# Patient Record
Sex: Male | Born: 1960 | Race: White | Hispanic: No | Marital: Married | Smoking: Never smoker
Health system: Southern US, Community
[De-identification: ages and names within clinical notes are randomized; demographics above are authoritative.]

## PROBLEM LIST (undated history)

## (undated) HISTORY — PX: HERNIA REPAIR: SHX51

---

## 1998-06-02 ENCOUNTER — Ambulatory Visit (HOSPITAL_BASED_OUTPATIENT_CLINIC_OR_DEPARTMENT_OTHER): Admission: RE | Admit: 1998-06-02 | Discharge: 1998-06-02 | Payer: Self-pay | Admitting: *Deleted

## 2000-04-09 ENCOUNTER — Emergency Department (HOSPITAL_COMMUNITY): Admission: EM | Admit: 2000-04-09 | Discharge: 2000-04-09 | Payer: Self-pay | Admitting: Emergency Medicine

## 2000-04-09 ENCOUNTER — Encounter: Payer: Self-pay | Admitting: Emergency Medicine

## 2002-12-03 ENCOUNTER — Emergency Department (HOSPITAL_COMMUNITY): Admission: EM | Admit: 2002-12-03 | Discharge: 2002-12-03 | Payer: Self-pay | Admitting: Emergency Medicine

## 2010-07-17 ENCOUNTER — Emergency Department (HOSPITAL_COMMUNITY)
Admission: EM | Admit: 2010-07-17 | Discharge: 2010-07-17 | Disposition: A | Discharge status: Home / Self Care | Payer: Worker's Compensation | Attending: Emergency Medicine | Admitting: Emergency Medicine

## 2010-07-17 DIAGNOSIS — Z7721 Contact with and (suspected) exposure to potentially hazardous body fluids: Secondary | ICD-10-CM | POA: Insufficient documentation

## 2010-07-17 LAB — HEPATITIS C ANTIBODY (REFLEX): HCV Ab: NEGATIVE

## 2010-07-17 LAB — HEPATITIS B SURFACE ANTIGEN: Hepatitis B Surface Ag: NEGATIVE

## 2010-07-17 LAB — HIV ANTIBODY (ROUTINE TESTING W REFLEX): HIV: NONREACTIVE

## 2011-05-26 ENCOUNTER — Encounter: Payer: Self-pay | Admitting: *Deleted

## 2011-05-26 ENCOUNTER — Emergency Department (HOSPITAL_COMMUNITY)
Admission: EM | Admit: 2011-05-26 | Discharge: 2011-05-26 | Disposition: A | Discharge status: Home / Self Care | Payer: 59 | Attending: Emergency Medicine | Admitting: Emergency Medicine

## 2011-05-26 ENCOUNTER — Emergency Department (HOSPITAL_COMMUNITY): Payer: 59

## 2011-05-26 DIAGNOSIS — N23 Unspecified renal colic: Secondary | ICD-10-CM

## 2011-05-26 DIAGNOSIS — R109 Unspecified abdominal pain: Secondary | ICD-10-CM | POA: Insufficient documentation

## 2011-05-26 DIAGNOSIS — N201 Calculus of ureter: Secondary | ICD-10-CM | POA: Insufficient documentation

## 2011-05-26 DIAGNOSIS — N509 Disorder of male genital organs, unspecified: Secondary | ICD-10-CM | POA: Insufficient documentation

## 2011-05-26 LAB — BASIC METABOLIC PANEL
BUN: 15 mg/dL (ref 6–23)
Creatinine, Ser: 1.02 mg/dL (ref 0.50–1.35)
GFR calc Af Amer: >90 mL/min (ref >90)
GFR calc non Af Amer: 83 mL/min — ABNORMAL LOW (ref >90)
Potassium: 4.1 mEq/L (ref 3.5–5.1)

## 2011-05-26 MED ORDER — SODIUM CHLORIDE 0.9 % IV BOLUS (SEPSIS)
1000.0000 mL | Freq: Once | INTRAVENOUS | Status: AC
  Administered 2011-05-26: 1000 mL via INTRAVENOUS

## 2011-05-26 MED ORDER — OXYCODONE-ACETAMINOPHEN 5-325 MG PO TABS: 1.0000 | ORAL_TABLET | Freq: Four times a day (QID) | ORAL | Status: AC | PRN

## 2011-05-26 MED ORDER — NAPROXEN 500 MG PO TABS: 500.0000 mg | ORAL_TABLET | Freq: Two times a day (BID) | ORAL | Status: AC

## 2011-05-26 MED ORDER — ONDANSETRON HCL 4 MG/2ML IJ SOLN: 4.0000 mg | Freq: Once | INTRAMUSCULAR | Status: DC

## 2011-05-26 MED ORDER — KETOROLAC TROMETHAMINE 30 MG/ML IJ SOLN
30.0000 mg | Freq: Once | INTRAMUSCULAR | Status: AC
  Administered 2011-05-26: 30 mg via INTRAVENOUS
  Filled 2011-05-26: qty 1

## 2011-05-26 MED ORDER — ONDANSETRON HCL 4 MG PO TABS: 4.0000 mg | ORAL_TABLET | Freq: Four times a day (QID) | ORAL | Status: AC

## 2011-05-26 MED ORDER — HYDROMORPHONE HCL PF 1 MG/ML IJ SOLN
1.0000 mg | Freq: Once | INTRAMUSCULAR | Status: AC
  Administered 2011-05-26: 1 mg via INTRAVENOUS
  Filled 2011-05-26: qty 1

## 2011-05-26 MED ORDER — TAMSULOSIN HCL 0.4 MG PO CAPS: 0.4000 mg | ORAL_CAPSULE | Freq: Every day | ORAL | Status: DC

## 2011-05-26 MED ORDER — ONDANSETRON HCL 4 MG/2ML IJ SOLN
INTRAMUSCULAR | Status: AC
  Filled 2011-05-26: qty 2

## 2011-05-26 MED ORDER — ONDANSETRON HCL 4 MG/2ML IJ SOLN
4.0000 mg | Freq: Once | INTRAMUSCULAR | Status: AC
  Administered 2011-05-26: 4 mg via INTRAVENOUS
  Filled 2011-05-26: qty 2

## 2011-05-26 NOTE — ED Provider Notes (Signed)
History     CSN: [id number]  Arrival date & time 05/26/11  4098   First MD Initiated Contact with Patient 05/26/11 0840      Chief Complaint  Patient presents with  . Nephrolithiasis    (Consider location/radiation/quality/duration/timing/severity/associated sxs/prior treatment) HPI Comments: Intermittent left flank pain over the past 2 days however awoke this morning with worsening left flank pain. Has associated nausea and vomiting. Had one episode of nonbloody, nonbilious emesis. He denies dysuria, frequency, urgency, hematuria. No prior history of stones. There is no known injury. He denies loss of bowel or bladder function.  Patient is a 50 y.o. male presenting with flank pain. The history is provided by the patient. No language interpreter was used.  Flank Pain This is a new problem. The current episode started 2 days ago. Episode frequency: intermittently. The problem has been gradually worsening. Pertinent negatives include no chest pain, no abdominal pain, no headaches and no shortness of breath. The symptoms are aggravated by nothing. The symptoms are relieved by nothing. He has tried nothing for the symptoms. The treatment provided no relief.    History reviewed. No pertinent past medical history.  Past Surgical History  Procedure Date  . Hernia repair     No family history on file.  History  Substance Use Topics  . Smoking status: Never Smoker   . Smokeless tobacco: Not on file  . Alcohol Use: Yes     couple times a week      Review of Systems  Constitutional: Negative for fever, activity change, appetite change and fatigue.  HENT: Negative for congestion, sore throat, rhinorrhea, neck pain and neck stiffness.   Respiratory: Negative for cough and shortness of breath.   Cardiovascular: Negative for chest pain and palpitations.  Gastrointestinal: Positive for nausea and vomiting. Negative for abdominal pain, diarrhea and constipation.  Genitourinary:  Positive for flank pain and testicular pain. Negative for dysuria, urgency, frequency and hematuria.  Musculoskeletal: Positive for back pain. Negative for myalgias and arthralgias.  Neurological: Negative for dizziness, weakness, light-headedness, numbness and headaches.  All other systems reviewed and are negative.    Allergies  Review of patient's allergies indicates no known allergies.  Home Medications   Current Outpatient Rx  Name Route Sig Dispense Refill  . RANITIDINE HCL 150 MG PO TABS Oral Take 150 mg by mouth daily.      Marland Kitchen NAPROXEN 500 MG PO TABS Oral Take 1 tablet (500 mg total) by mouth 2 (two) times daily. 30 tablet 0  . ONDANSETRON HCL 4 MG PO TABS Oral Take 1 tablet (4 mg total) by mouth every 6 (six) hours. 12 tablet 0  . OXYCODONE-ACETAMINOPHEN 5-325 MG PO TABS Oral Take 1-2 tablets by mouth every 6 (six) hours as needed for pain. 20 tablet 0  . TAMSULOSIN HCL 0.4 MG PO CAPS Oral Take 1 capsule (0.4 mg total) by mouth daily. 10 capsule 0    BP 132/71  Pulse 69  Temp(Src) 97.1 F (36.2 C) (Oral)  Resp 16  SpO2 100%  Physical Exam  Nursing note and vitals reviewed. Constitutional: He is oriented to person, place, and time. He appears well-developed and well-nourished.       Uncomfortable appearing  HENT:  Head: Normocephalic and atraumatic.  Mouth/Throat: Oropharynx is clear and moist.  Eyes: Conjunctivae and EOM are normal. Pupils are equal, round, and reactive to light.  Neck: Normal range of motion. Neck supple.  Cardiovascular: Normal rate, regular rhythm, normal heart sounds and  intact distal pulses.  Exam reveals no gallop and no friction rub.   No murmur heard. Pulmonary/Chest: Effort normal and breath sounds normal. No respiratory distress.  Abdominal: Soft. Bowel sounds are normal. There is no tenderness. Hernia confirmed negative in the right inguinal area and confirmed negative in the left inguinal area.       No cva tenderness  Genitourinary:  Testes normal and penis normal. Cremasteric reflex is present. Right testis shows no mass, no swelling and no tenderness. Left testis shows no mass, no swelling and no tenderness.  Musculoskeletal: Normal range of motion. He exhibits no tenderness.  Neurological: He is alert and oriented to person, place, and time.  Skin: Skin is warm and dry. No rash noted.    ED Course  Procedures (including critical care time)  Labs Reviewed  BASIC METABOLIC PANEL - Abnormal; Notable for the following:    Glucose, Bld 119 (*)    GFR calc non Af Amer 83 (*)    All other components within normal limits  URINALYSIS, ROUTINE W REFLEX MICROSCOPIC   Ct Abdomen Pelvis Wo Contrast  05/26/2011  *RADIOLOGY REPORT*  Clinical Data: Left flank pain, nausea/vomiting, history of hernia repair, evaluate for kidney stone  CT ABDOMEN AND PELVIS WITHOUT CONTRAST  Technique:  Multidetector CT imaging of the abdomen and pelvis was performed following the standard protocol without intravenous contrast.  Comparison: None.  Findings: Linear scarring versus atelectasis in the right middle lobe and bilateral lower lobes.  Unenhanced liver, spleen, pancreas, and adrenal glands are within normal limits.  Gallbladder is unremarkable.  No intrahepatic or extrahepatic ductal dilatation.  Kidneys are within normal limits.  Mild fullness of the left renal collecting system.  No evidence of bowel obstruction.  Normal appendix.  No evidence of abdominal aortic aneurysm.  No abdominopelvic ascites.  No suspicious abdominopelvic lymphadenopathy.  Prostate is unremarkable.  2 mm calculus in the distal left ureter (series 2/image 82). Bladder is unremarkable.  Tiny fat containing right inguinal hernia.  Mild degenerative changes of the visualized thoracolumbar spine.  IMPRESSION: 2 mm calculus in the distal left ureter with mild fullness of the left renal collecting system.  Original Report Authenticated By: Charline Bills, M.D.     1.  Ureterolithiasis   2. Renal colic on left side       MDM  CT shows a ureteral stone with no evidence of obstruction. Patient received IV fluids, pain medication, antiemetics with improvement of his symptoms. At this time the patient is stable for discharge to home. I encouraged him to strain his urine and followup with urology within one week. He'll call today to make an appointment. He'll be discharged home with Flomax, Percocet, Naprosyn, Zofran. provided clear signs and symptoms for which to return to the emergency department        Dayton Bailiff, MD 05/26/11 1045

## 2011-05-26 NOTE — ED Notes (Signed)
C/o left lower back pain accompanied by nausea started yesterday but has increased today, can void and has had some diarrhea

## 2011-05-26 NOTE — ED Notes (Signed)
Pt believes he may have a kidney stone. Reports L flank pain and nausea beginning this am.

## 2011-05-26 NOTE — ED Notes (Signed)
Patient and wife verbalized understanding of discharge instructions and prescriptions

## 2013-01-08 ENCOUNTER — Emergency Department (HOSPITAL_COMMUNITY): Payer: Worker's Compensation

## 2013-01-08 ENCOUNTER — Encounter (HOSPITAL_COMMUNITY): Payer: Self-pay | Admitting: Radiology

## 2013-01-08 ENCOUNTER — Emergency Department (HOSPITAL_COMMUNITY)
Admission: EM | Admit: 2013-01-08 | Discharge: 2013-01-08 | Disposition: A | Discharge status: Home / Self Care | Payer: Worker's Compensation | Attending: Orthopedic Surgery | Admitting: Orthopedic Surgery

## 2013-01-08 DIAGNOSIS — S52121A Displaced fracture of head of right radius, initial encounter for closed fracture: Secondary | ICD-10-CM

## 2013-01-08 DIAGNOSIS — W108XXA Fall (on) (from) other stairs and steps, initial encounter: Secondary | ICD-10-CM | POA: Insufficient documentation

## 2013-01-08 DIAGNOSIS — S52009A Unspecified fracture of upper end of unspecified ulna, initial encounter for closed fracture: Secondary | ICD-10-CM | POA: Insufficient documentation

## 2013-01-08 DIAGNOSIS — S42401A Unspecified fracture of lower end of right humerus, initial encounter for closed fracture: Secondary | ICD-10-CM

## 2013-01-08 DIAGNOSIS — Y929 Unspecified place or not applicable: Secondary | ICD-10-CM | POA: Insufficient documentation

## 2013-01-08 DIAGNOSIS — S53106A Unspecified dislocation of unspecified ulnohumeral joint, initial encounter: Secondary | ICD-10-CM | POA: Insufficient documentation

## 2013-01-08 DIAGNOSIS — S52041A Displaced fracture of coronoid process of right ulna, initial encounter for closed fracture: Secondary | ICD-10-CM

## 2013-01-08 DIAGNOSIS — Y9389 Activity, other specified: Secondary | ICD-10-CM | POA: Insufficient documentation

## 2013-01-08 LAB — BASIC METABOLIC PANEL
Chloride: 105 mEq/L (ref 96–112)
GFR calc Af Amer: >90 mL/min (ref >90)
Potassium: 4.2 mEq/L (ref 3.5–5.1)

## 2013-01-08 LAB — CBC WITH DIFFERENTIAL/PLATELET
Basophils Absolute: 0 10*3/uL (ref 0.0–0.1)
Basophils Relative: 0 % (ref 0–1)
MCHC: 35.6 g/dL (ref 30.0–36.0)
Neutro Abs: 6.5 10*3/uL (ref 1.7–7.7)
Neutrophils Relative %: 81 % — ABNORMAL HIGH (ref 43–77)
RDW: 13 % (ref 11.5–15.5)

## 2013-01-08 MED ORDER — OXYCODONE-ACETAMINOPHEN 5-325 MG PO TABS
2.0000 | ORAL_TABLET | Freq: Once | ORAL | Status: AC
  Administered 2013-01-08: 2 via ORAL
  Filled 2013-01-08: qty 2

## 2013-01-08 MED ORDER — NALOXONE HCL 0.4 MG/ML IJ SOLN
INTRAMUSCULAR | Status: AC
  Filled 2013-01-08: qty 1

## 2013-01-08 MED ORDER — NAPROXEN 250 MG PO TABS
500.0000 mg | ORAL_TABLET | Freq: Once | ORAL | Status: AC
  Administered 2013-01-08: 500 mg via ORAL
  Filled 2013-01-08: qty 2

## 2013-01-08 MED ORDER — NAPROXEN 500 MG PO TABS: 500.0000 mg | ORAL_TABLET | Freq: Two times a day (BID) | ORAL | Status: AC

## 2013-01-08 MED ORDER — ONDANSETRON HCL 4 MG/2ML IJ SOLN
INTRAMUSCULAR | Status: AC
  Administered 2013-01-08: 4 mg
  Filled 2013-01-08: qty 2

## 2013-01-08 MED ORDER — OXYCODONE-ACETAMINOPHEN 5-325 MG PO TABS: 1.0000 | ORAL_TABLET | ORAL | Status: DC | PRN

## 2013-01-08 MED ORDER — NAPROXEN 500 MG PO TABS: 500.0000 mg | ORAL_TABLET | Freq: Two times a day (BID) | ORAL | Status: DC

## 2013-01-08 MED ORDER — FENTANYL CITRATE 0.05 MG/ML IJ SOLN
200.0000 ug | Freq: Once | INTRAMUSCULAR | Status: AC
  Administered 2013-01-08: 200 ug via INTRAVENOUS
  Filled 2013-01-08: qty 4

## 2013-01-08 MED ORDER — ETOMIDATE 2 MG/ML IV SOLN
10.0000 mg | Freq: Once | INTRAVENOUS | Status: AC
  Administered 2013-01-08: 10 mg via INTRAVENOUS
  Filled 2013-01-08: qty 10

## 2013-01-08 MED ORDER — ONDANSETRON HCL 4 MG/2ML IJ SOLN
4.0000 mg | Freq: Once | INTRAMUSCULAR | Status: AC
  Administered 2013-01-08: 4 mg via INTRAVENOUS
  Filled 2013-01-08: qty 2

## 2013-01-08 MED ORDER — FENTANYL CITRATE 0.05 MG/ML IJ SOLN
100.0000 ug | INTRAMUSCULAR | Status: DC | PRN
  Administered 2013-01-08: 50 ug via INTRAVENOUS
  Filled 2013-01-08: qty 4

## 2013-01-08 MED ORDER — OXYCODONE-ACETAMINOPHEN 5-325 MG PO TABS: 1.0000 | ORAL_TABLET | ORAL | Status: AC | PRN

## 2013-01-08 NOTE — ED Notes (Signed)
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  He states that he slipped and did not pass out.  The fall resulted in R elbow pain.  He reports that after the fall he walked over to the truck and attempted to sit on the bumper when he reports to having passed out.  Denies neck or back pain.  + PMS in R hand distal to the injury.  Pt received 200 mcg of Fentanyl IV.

## 2013-01-08 NOTE — ED Provider Notes (Signed)
CSN: [id number]     Arrival date & time 01/08/13  4098 History     First MD Initiated Contact with Patient 01/08/13 0848     Chief Complaint  Patient presents with  . Elbow Pain   (Consider location/radiation/quality/duration/timing/severity/associated sxs/prior Treatment) HPI Comments: Patient is a 52 year old male with no significant past medical history who presents with right elbow pain that started after a mechanical fall that occurred prior to arrival. 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 He landed on his right elbow and reports sudden onset of throbbing, severe pain without radiation. He denies head trauma or LOC. Patient denies any other injury. Movement and palpation of the affected area makes the pain worse. Nothing makes the pain better. Patient received fentanyl for pain per EMS en route.    No past medical history on file. Past Surgical History  Procedure Laterality Date  . Hernia repair     No family history on file. History  Substance Use Topics  . Smoking status: Never Smoker   . Smokeless tobacco: Not on file  . Alcohol Use: Yes     Comment: couple times a week    Review of Systems  Musculoskeletal: Positive for arthralgias.  All other systems reviewed and are negative.    Allergies  Review of patient's allergies indicates no known allergies.  Home Medications   Current Outpatient Rx  Name  Route  Sig  Dispense  Refill  . ranitidine (ZANTAC) 150 MG tablet   Oral   Take 150 mg by mouth daily as needed for heartburn.           BP 107/72  Pulse 71  Resp 18  SpO2 94% Physical Exam  Nursing note and vitals reviewed. Constitutional: He is oriented to person, place, and time. He appears well-developed and well-nourished. No distress.  HENT:  Head: Normocephalic and atraumatic.  Eyes: Conjunctivae are normal.  Neck: Normal range of motion.  Cardiovascular: Normal rate, regular rhythm and  intact distal pulses.  Exam reveals no gallop and no friction rub.   No murmur heard. Sufficient distal capillary refill of upper extremities.   Pulmonary/Chest: Effort normal and breath sounds normal. He has no wheezes. He has no rales. He exhibits no tenderness.  Abdominal: Soft. There is no tenderness.  Musculoskeletal:  Right elbow AC area tender to palpation. No obvious deformity. ROM limited due to pain. No right wrist or right elbow tenderness to palpation, deformity or tenderness to palpate.    Neurological: He is alert and oriented to person, place, and time.  Upper extremity sensation intact and equal bilaterally. Speech is goal-oriented. Moves limbs without ataxia.   Skin: Skin is warm and dry.  Psychiatric: He has a normal mood and affect. His behavior is normal.    ED Course   Procedures (including critical care time)  Labs Reviewed - No data to display Dg Elbow Complete Right  01/08/2013   *RADIOLOGY REPORT*  Clinical Data: Pain post trauma  RIGHT ELBOW - COMPLETE 3+ VIEW  Comparison: None.  Findings: Frontal, oblique, and lateral images were obtained. There is a comminuted fracture of the proximal radius with multiple fracture fragments arising from the dorsal head of the radius displaced distally.  There is dislocation at the elbow joint with the ulna displaced posterior to the humerus.  There is a tiny fracture fragment within the elbow joint.  IMPRESSION: Comminuted fracture of the proximal  radius along the dorsal aspect with dislocation of the elbow joint.  A tiny fracture fragment is located within the elbow joint region.   Original Report Authenticated By: Bretta Bang, M.D.   1. Radial head fracture, closed, right, initial encounter   2. Fracture of coronoid process of right ulna   3. Fracture dislocation of elbow joint, right, closed, initial encounter     MDM  8:55 AM Xray of right elbow pending. No signs of neurovascular compromise. Patient will have fentanyl  for pain.    9:37 AM Patient's xray shows comminuted fracture of proximal radius and dislocation of elbow joint. I will consult Orthopedics.   Joint reduced and splinted. CT of elbow done. Dr. Janee Morn will see the patient in the ED.   Emilia Beck, [place] 01/10/13 [phone]

## 2013-01-08 NOTE — Consult Note (Signed)
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 In the emergency department he is recognized a comminuted radial head fracture and a incomplete dislocation/subluxation of the ulna humeral joint. Closed reduction was successfully performed by the emergency department providers and he is in a sling. His pain is reasonably well-controlled and he has remained n.p.o.   History reviewed. No pertinent past medical history. Past Surgical History  Procedure Laterality Date  . Hernia repair     History   Social History  . Marital Status: Single    Spouse Name: N/A    Number of Children: N/A  . Years of Education: N/A   Social History Main Topics  . Smoking status: Never Smoker   . Smokeless tobacco: None  . Alcohol Use: Yes     Comment: couple times a week  . Drug Use: No  . Sexually Active:    Other Topics Concern  . None   Social History Narrative  . None   No family history on file. No Known Allergies Prior to Admission medications   Medication Sig Start Date End Date Taking? Authorizing Provider  ranitidine (ZANTAC) 150 MG tablet Take 150 mg by mouth daily as needed for heartburn.    Yes Historical Provider, MD  naproxen (NAPROSYN) 500 MG tablet Take 1 tablet (500 mg total) by mouth 2 (two) times daily with a meal. 01/08/13   Jodi Marble, MD  oxyCODONE-acetaminophen (PERCOCET/ROXICET) 5-325 MG per tablet Take 1-2 tablets by mouth every 4 (four) hours as needed for pain. 01/08/13   Jodi Marble, MD   Dg Elbow Complete Right  01/08/2013   *RADIOLOGY REPORT*  Clinical Data: Post reduction for fracture/dislocation  RIGHT ELBOW -  2  VIEW  Comparison: Study obtained earlier in the day  Findings:  Frontal and lateral views were obtained.  The elbow dislocation is been  successfully reduced.  There is a fracture of the dorsal aspect of the radial head in near anatomic alignment. No other fractures.  Joint spaces appear intact.  IMPRESSION: Previous dislocation has been reduced.  Fracture dorsal portion of radial head present.   Original Report Authenticated By: Bretta Bang, M.D.   Dg Elbow Complete Right  01/08/2013   *RADIOLOGY REPORT*  Clinical Data: Pain post trauma  RIGHT ELBOW - COMPLETE 3+ VIEW  Comparison: None.  Findings: Frontal, oblique, and lateral images were obtained. There is a comminuted fracture of the proximal radius with multiple fracture fragments arising from the dorsal head of the radius displaced distally.  There is dislocation at the elbow joint with the ulna displaced posterior to the humerus.  There is a tiny fracture fragment within the elbow joint.  IMPRESSION: Comminuted fracture of the proximal radius along the dorsal aspect with dislocation of the elbow joint.  A tiny fracture fragment is located within the elbow joint region.   Original Report Authenticated By: Bretta Bang, M.D.   Ct Elbow Right W/o Cm  01/08/2013   *RADIOLOGY REPORT*  Clinical Data: Status post elbow dislocation.  CT OF THE RIGHT ELBOW WITHOUT CONTRAST  Technique:  Multidetector CT imaging was performed according to the standard protocol. Multiplanar CT image reconstructions were also generated.  Comparison: Radiographs 01/08/2013.  Findings: The elbow dislocation has been reduced.  There is a comminuted intra-articular fracture of the radial head with slight  volar displacement.  The fracture extends transversely across the midportion of the radial head and the anterior/volar component is split vertically.  Displaced fracture of the coronoid process of the ulna is also demonstrated there is approximately 8 mm of volar displacement. The humerus is intact.  The olecranon is intact.  IMPRESSION:  1.  Comminuted and mildly displaced fractures of the radial head as  described above. 2.  Displaced coronoid process fracture of the ulna. 3.  The humerus is intact.   Original Report Authenticated By: Rudie Meyer, M.D.    Positive ROS: All other systems have been reviewed and were otherwise negative with the exception of those mentioned in the HPI and as above.  Physical Exam: Vitals: Refer to EMR. Constitutional:  WD, WN, NAD HEENT:  NCAT, EOMI Neuro/Psych:  Alert & oriented to person, place, and time; appropriate mood & affect Lymphatic: No generalized UE edema or lymphadenopathy Extremities / MSK:  The extremities are normal with respect to appearance, ranges of motion, joint stability, muscle strength/tone, sensation, & perfusion except as otherwise noted:   Right upper extremity is in a long-arm splint. Fingers are free and not very swollen or puffy. He has intact light touch sensation the radial, median, and ulnar nerve distributions. Motor appears intact, to include AIN and PIN. The fingers are warm with brisk capillary refill  Assessment: Comminuted right radial head fracture in setting of coronoid fracture and elbow instability  Plan: I discussed with the patient and his wife today that he will require skeletal reconstruction. I explained all the reasons why I prefer to perform such at 5-10 days post injury. We discussed a surgical plan includes a lateral approach, reconstruction of the radial head versus prosthetic replacement, and evaluation of the elbow stability, with further repair of structures as indicated. The goal of surgery is to restore a stable functional elbow while minimizing the risk for stiffness. Risks include but are not limited to general anesthetic risks, bleeding, infection, damage to nerves or blood vessels that could result in permanent numbness, weakness, and/or pain. Stiffness, persistent instability, and chronic pain. We will likely proceed surgically next Tuesday. We also discussed the signs and symptoms of neurovascular  compromise and compartment syndrome and he was instructed to seek immediate attention should these develop. He will be discharged with a plan for Percocet for analgesia, naproxen  to reduce inflammation and reduce the risk for heterotopic ossification, and a sling. He was given instructions for elevation and digital range of motion.  Cliffton Asters Janee Morn, MD     Mobile [phone] Orthopaedic & Hand Surgery [facility]opaedic [facility] Sports [facility] [address] [place], [place]  [address] [phone]

## 2013-01-08 NOTE — ED Notes (Signed)
Orthopedic surgery at bedside

## 2013-01-08 NOTE — Progress Notes (Signed)
Orthopedic Tech Progress Note Patient Details:  Anthony Mason [birth year] [id number] Elbow reduction. Posterior long arm splint with sugartong applied. Extra padding added to protect elbow. Application tolerated well.  Ortho Devices Type of Ortho Device: Ace wrap;Post (long arm) splint;Sugartong splint Ortho Device/Splint Location: Right UE Ortho Device/Splint Interventions: Application   Asia R Thompson 01/08/2013, 11:23 AM

## 2013-01-08 NOTE — ED Notes (Signed)
Patient transported to CT 

## 2013-01-08 NOTE — ED Notes (Signed)
Patient transported to X-ray 

## 2013-01-08 NOTE — Discharge Instructions (Addendum)
Discharge Instructions--elbow dislocation with fracture   No work for next 2 weeks.  You will have a dressing with a plaster splint incorporated in it. Move your fingers as much as possible, making a full fist and fully opening the fist. Elevate your hand to reduce pain & swelling of the digits.  Ice over the operative site may be helpful to reduce pain & swelling.  DO NOT USE HEAT. Pain medicine has been prescribed for you.  Use your medicine as needed over the first 48 hours, and then you can begin to taper your use.  You may use Tylenol in place of your prescribed pain medication, but not IN ADDITION to it. Leave the dressing in place until you return to our office.  You may shower, but keep the bandage clean & dry.  My office will contact you this week regarding surgical planning for next week.  Please call [phone] during normal business hours or [phone] after hours for any problems.   *Please note that pain medications will not be refilled after hours or on weekends.  Splint Care Your doctor has applied a splint to rest and protect your injury. Splints can be made of plaster, fiberglass, or metal; they are used to treat fractures, sprains, tendonitis, and other injuries. Please keep your injury elevated to reduce swelling and pressure under your splint. If an elastic bandage has been used to hold the splint, it can be loosened for increased swelling or pain. Try to keep your splint clean and dry. They can be used for weeks if needed to treat serious sprains, or minor fractures. Do not put objects under your splint to scratch yourself. Call your doctor right away if you have:  Increased pain or pressure around the injury.  Numbness, tingling, or painful, cool toes or fingers. Call your doctor for follow up care as recommended, especially if your splint becomes too soft or broken before you are healed. Document Released: 06/23/2004 Document Revised: 08/08/2011 Document  Reviewed: 05/03/2007 [facility] Patient Information 2014 [place], [place].

## 2013-01-09 NOTE — ED Provider Notes (Signed)
Medical screening examination/treatment/procedure(s) were conducted as a shared visit with non-physician practitioner(s) and myself.  I personally evaluated the patient during the encounter.  52yo M, c/o right elbow pain after slip and fall down 3 steps PTA. States he landed on his right elbow. Denies hitting head, no LOC. VSS, A&O, CTA, RRR, abd soft/NT, neuro non-focal. +right elbow TTP with obvious deformity. NMS intact right hand with strong radial pulse, skin w/d/good color, and brisk cap refill in fingers. No open wounds. NT right shoulder/wrist/hand/fingers. Initial XR with comminuted fx proximal radius with posterior dislocation of elbow joint. T/C to Hand Ortho MD Janee Morn, case discussed, including:  HPI, pertinent PM/SHx, VS/PE, dx testing, ED course and treatment: requests EDP to reduce elbow, obtain CT elbow then call back and he can come to ED for eval.  Procedural sedation and dislocation reduction procedure were fully reviewed with the pt and his family, including explanation of risks/benefits. All questions answered. Pt and family verb understanding. Written and verbal informed consent for these procedures was obtained.  Procedures as below. Post XR and CT scan obtained. Hand Ortho Dr. Janee Morn, came to the ED for pt eval post-reduction and CT scan. He evaluated the pt and spoke with pt and his family (see his consult note). States he will d/c pt from the ED today to f/u for surgical repair next Tuesday. Pt and family are agreeable with this plan.   Procedural sedation:  Presedation checklist: Informed consent, including risks and benefits, obtained. Pre-procedural timeout performed with Pre-sedation confirmation of laterality/correct procedure site.  Provider confirms review of the nurses' note, allergies, medications, pertinent labs, PMH, pre-induction vital signs, pulse oximetry, pain level, and patient condition satisfactory for commencing with order for sedation/procedure.  Awake and  alert, Patient on monitor, Continuous pulse oximetry, Supplemental oxygen provided, NPO status verified, Suction available, Sedation reversal agent(s) at bedside as applicable; Resuscitative equipment readily available. Last meal: 6+ hours ago;  Physician time: 15 minutes; ASA Classification: 1. Normal healthy patient.;  Agent: Etomidate, fentanyl; Level: Moderate;  Complications: None; Treatment: None;  Reassessment: Awake, Alert, Oriented, Vital signs normal, Returned to presedation baseline, Procedure completed successfully, No complaints.  Orthopedic Procedure:  Timeout: Pre-procedural timeout; Indication: Posterior ulnar dislocation; Joint: right elbow; Description: Closed; Procedure: Informed consent obtained, Procedural sedation (see note above),  Closed reduction of dislocation, By traction;  Immobilization: By myself, Assisted by orthopedic technician, Long arm posterior splint with forearm sugartong; Post-reduction xray obtained, Reassessment: Pain improved, Neurovascularly intact, strong radial pulse, Anatomic alignment restored.  SPLINT APPLICATION Date/Time: 01/08/2013 11:15 AM Authorized by: Laray Anger Consent: Verbal consent obtained. Risks and benefits: risks, benefits and alternatives were discussed Consent given by: patient Splint applied by: myself and orthopedic technician Location details: right UE Splint type: right long arm posterior splint with forearm sugartong Right arm sling Post-procedure: The splinted body part was neurovascularly unchanged following the procedure. Patient tolerance: Patient tolerated the procedure well with no immediate complications.   Patient tolerated procedures and procedural sedation component as expected without apparent immediate complications.  Physician confirms procedural medication orders as administered, patient was assessed by physician post-procedure, and confirms post-sedation plan of care and disposition.    Dg  Elbow Complete Right 01/08/2013   *RADIOLOGY REPORT*  Clinical Data: Pain post trauma  RIGHT ELBOW - COMPLETE 3+ VIEW  Comparison: None.  Findings: Frontal, oblique, and lateral images were obtained. There is a comminuted fracture of the proximal radius with multiple fracture fragments arising from the dorsal head of the radius  displaced distally.  There is dislocation at the elbow joint with the ulna displaced posterior to the humerus.  There is a tiny fracture fragment within the elbow joint.  IMPRESSION: Comminuted fracture of the proximal radius along the dorsal aspect with dislocation of the elbow joint.  A tiny fracture fragment is located within the elbow joint region.   Original Report Authenticated By: Bretta Bang, M.D.   Dg Elbow Complete Right 01/08/2013   *RADIOLOGY REPORT*  Clinical Data: Post reduction for fracture/dislocation  RIGHT ELBOW -  2  VIEW  Comparison: Study obtained earlier in the day  Findings:  Frontal and lateral views were obtained.  The elbow dislocation is been successfully reduced.  There is a fracture of the dorsal aspect of the radial head in near anatomic alignment. No other fractures.  Joint spaces appear intact.  IMPRESSION: Previous dislocation has been reduced.  Fracture dorsal portion of radial head present.   Original Report Authenticated By: Bretta Bang, M.D.   Ct Elbow Right W/o Cm 01/08/2013   *RADIOLOGY REPORT*  Clinical Data: Status post elbow dislocation.  CT OF THE RIGHT ELBOW WITHOUT CONTRAST  Technique:  Multidetector CT imaging was performed according to the standard protocol. Multiplanar CT image reconstructions were also generated.  Comparison: Radiographs 01/08/2013.  Findings: The elbow dislocation has been reduced.  There is a comminuted intra-articular fracture of the radial head with slight volar displacement.  The fracture extends transversely across the midportion of the radial head and the anterior/volar component is split vertically.   Displaced fracture of the coronoid process of the ulna is also demonstrated there is approximately 8 mm of volar displacement. The humerus is intact.  The olecranon is intact.  IMPRESSION:  1.  Comminuted and mildly displaced fractures of the radial head as described above. 2.  Displaced coronoid process fracture of the ulna. 3.  The humerus is intact.   Original Report Authenticated By: Rudie Meyer, M.D.     Laray Anger, DO 01/09/13 2200

## 2013-01-11 NOTE — ED Provider Notes (Signed)
Medical screening examination/treatment/procedure(s) were conducted as a shared visit with non-physician practitioner(s) and myself.  I personally evaluated the patient during the encounter Please see my previous note  Laray Anger, DO 01/11/13 1456

## 2014-05-14 ENCOUNTER — Emergency Department (INDEPENDENT_AMBULATORY_CARE_PROVIDER_SITE_OTHER): Payer: Worker's Compensation

## 2014-05-14 ENCOUNTER — Encounter (HOSPITAL_COMMUNITY): Payer: Self-pay | Admitting: *Deleted

## 2014-05-14 ENCOUNTER — Emergency Department (INDEPENDENT_AMBULATORY_CARE_PROVIDER_SITE_OTHER)
Admission: EM | Admit: 2014-05-14 | Discharge: 2014-05-14 | Disposition: A | Discharge status: Home / Self Care | Payer: Worker's Compensation | Source: Home / Self Care | Attending: Family Medicine | Admitting: Family Medicine

## 2014-05-14 DIAGNOSIS — T1490XA Injury, unspecified, initial encounter: Secondary | ICD-10-CM

## 2014-05-14 DIAGNOSIS — S51011A Laceration without foreign body of right elbow, initial encounter: Secondary | ICD-10-CM | POA: Diagnosis not present

## 2014-05-14 DIAGNOSIS — T1490 Injury, unspecified: Secondary | ICD-10-CM | POA: Diagnosis not present

## 2014-05-14 DIAGNOSIS — S5001XA Contusion of right elbow, initial encounter: Secondary | ICD-10-CM

## 2014-05-14 DIAGNOSIS — S93402A Sprain of unspecified ligament of left ankle, initial encounter: Secondary | ICD-10-CM

## 2014-05-14 NOTE — ED Notes (Signed)
Pt  Reports   She  Injured   His  r  Elbow  Today        At  Work         [employer]Fell  Felled    And  Sustained  A  Laceration       -  He  Has       Had  Surgery  On that  Elbow     In  Past          He  Also  Twisted  His     l  Ankle       And  Has  Pain  And  Swelling  Present

## 2014-05-14 NOTE — ED Provider Notes (Signed)
CSN: [id number]     Arrival date & time 05/14/14  1655 History   First MD Initiated Contact with Patient 05/14/14 1712     Chief Complaint  Patient presents with  . 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 Left ankle is swollen and but uncomfortable with weight bearing and he has two abrasions to right elbow and joint is mildly tender with ROM. Has surgery to right elbow about 18 mos ago with Dr. Janee Mornhompson. Reported tetanus booster to be UTD as of Sept. 2015.    The history is provided by the patient.    History reviewed. No pertinent past medical history. Past Surgical History  Procedure Laterality Date  . Hernia repair     History reviewed. No pertinent family history. History  Substance Use Topics  . Smoking status: Never Smoker   . Smokeless tobacco: Not on file  . Alcohol Use: Yes     Comment: couple times a week    Review of Systems  All other systems reviewed and are negative.   Allergies  Review of patient's allergies indicates no known allergies.  Home Medications   Prior to Admission medications   Medication Sig Start Date End Date Taking? Authorizing Provider  naproxen (NAPROSYN) 500 MG tablet Take 1 tablet (500 mg total) by mouth 2 (two) times daily with a meal. 01/08/13   Jodi Marbleavid A Thompson, MD  oxyCODONE-acetaminophen (PERCOCET/ROXICET) 5-325 MG per tablet Take 1-2 tablets by mouth every 4 (four) hours as needed for pain. 01/08/13   Jodi Marbleavid A Thompson, MD  ranitidine (ZANTAC) 150 MG tablet Take 150 mg by mouth daily as needed for heartburn.     Historical Provider, MD   BP 130/82 mmHg  Pulse 76  Temp(Src) 97.9 F (36.6 C) (Oral)  Resp 16  SpO2 99% Physical Exam  Constitutional: He is oriented to person,  place, and time. He appears well-developed and well-nourished. No distress.  HENT:  Head: Normocephalic and atraumatic.  Eyes: Conjunctivae are normal.  Cardiovascular: Normal rate.   Pulmonary/Chest: Effort normal.  Musculoskeletal: Normal range of motion.       Right elbow: He exhibits swelling and laceration. He exhibits normal range of motion, no effusion and no deformity. Tenderness found. Olecranon process tenderness noted.       Left ankle: He exhibits swelling. He exhibits normal range of motion, no ecchymosis, no deformity, no laceration and normal pulse. Tenderness. Lateral malleolus tenderness found. No medial malleolus, no head of 5th metatarsal and no proximal fibula tenderness found. Achilles tendon normal.       Arms: Moderate swelling at region of lateral malleolus  Neurological: He is alert and oriented to person, place, and time.  Skin: Skin is warm and dry. No rash noted. No erythema.  See exam of elbow  Psychiatric: He has a normal mood and affect. His behavior is normal.  Nursing note and vitals reviewed.   ED Course  LACERATION REPAIR Date/Time: 05/14/2014 6:41 PM Performed by: Lemmie EvensPRESSON, Nery Kalisz LEE H Authorized by: Bradd CanaryKINDL, JAMES D Consent: Verbal consent obtained. Risks and benefits: risks, benefits and alternatives were discussed Consent given by: patient Patient identity confirmed: verbally with patient Time out: Immediately prior to procedure a "time out" was called to verify the correct patient, procedure, equipment, support staff and site/side marked as required. Body  area: upper extremity Laceration length: 0.5 cm Foreign bodies: no foreign bodies Tendon involvement: none Nerve involvement: none Vascular damage: no Patient sedated: no Preparation: Patient was prepped and draped in the usual sterile fashion. Irrigation solution: saline Irrigation method: syringe Amount of cleaning: standard Debridement: none Skin closure: staples Number of sutures:  1 Approximation: close Approximation difficulty: simple Dressing: gauze roll Patient tolerance: Patient tolerated the procedure well with no immediate complications   (including critical care time) Labs Review Labs Reviewed - No data to display  Imaging Review Dg Elbow Complete Right  05/14/2014   CLINICAL DATA:  Fall today previous fracture 16 months ago, elbow bruising  EXAM: RIGHT ELBOW - COMPLETE 3+ VIEW  COMPARISON:  01/08/2013  FINDINGS: Four views of the right elbow submitted. No acute fracture or subluxation. Metallic fixation screws are noted radial head. Mild degenerative changes elbow joint. No posterior fat pad sign.  IMPRESSION: No acute fracture or subluxation. Postsurgical changes radial head. Mild degenerative changes elbow joint.   Electronically Signed   By: Natasha MeadLiviu  Pop M.D.   On: 05/14/2014 17:56   Dg Ankle Complete Left  05/14/2014   CLINICAL DATA:  Larey SeatFell off curb, twisted left ankle  EXAM: LEFT ANKLE COMPLETE - 3+ VIEW  COMPARISON:  None.  FINDINGS: Three views of left ankle submitted. No acute fracture or subluxation. Ankle mortise is preserved. Soft tissue swelling adjacent to lateral malleolus.  IMPRESSION: No acute fracture or subluxation.  Lateral soft tissue swelling.   Electronically Signed   By: Natasha MeadLiviu  Pop M.D.   On: 05/14/2014 17:57     MDM   1. Elbow contusion, right, initial encounter   2. Injury   3. Elbow laceration, right, initial encounter   4. Left ankle sprain, initial encounter   Films negative for acute fx/sublux. ASO and RICE therapy for left ankle. Weight bearing as tolerated.  RICE, wound care and NSAIDs for right elbow. Ortho follow up (Dr. Janee Mornhompson) if no improvement over the next 1-2 weeks. Staple removal in 10 days      Ria ClockJennifer Lee H Twylla Arceneaux, [place] 05/14/14 [phone]

## 2014-05-14 NOTE — Discharge Instructions (Signed)
Staple removal in 10 days.   Ankle Sprain An ankle sprain is an injury to the strong, fibrous tissues (ligaments) that hold the bones of your ankle joint together.  CAUSES An ankle sprain is usually caused by a fall or by twisting your ankle. Ankle sprains most commonly occur when you step on the outer edge of your foot, and your ankle turns inward. People who participate in sports are more prone to these types of injuries.  SYMPTOMS   Pain in your ankle. The pain may be present at rest or only when you are trying to stand or walk.  Swelling.  Bruising. Bruising may develop immediately or within 1 to 2 days after your injury.  Difficulty standing or walking, particularly when turning corners or changing directions. DIAGNOSIS  Your caregiver will ask you details about your injury and perform a physical exam of your ankle to determine if you have an ankle sprain. During the physical exam, your caregiver will press on and apply pressure to specific areas of your foot and ankle. Your caregiver will try to move your ankle in certain ways. An X-ray exam may be done to be sure a bone was not broken or a ligament did not separate from one of the bones in your ankle (avulsion fracture).  TREATMENT  Certain types of braces can help stabilize your ankle. Your caregiver can make a recommendation for this. Your caregiver may recommend the use of medicine for pain. If your sprain is severe, your caregiver may refer you to a surgeon who helps to restore function to parts of your skeletal system (orthopedist) or a physical therapist. HOME CARE INSTRUCTIONS   Apply ice to your injury for 1-2 days or as directed by your caregiver. Applying ice helps to reduce inflammation and pain.  Put ice in a plastic bag.  Place a towel between your skin and the bag.  Leave the ice on for 15-20 minutes at a time, every 2 hours while you are awake.  Only take over-the-counter or prescription medicines for pain,  discomfort, or fever as directed by your caregiver.  Elevate your injured ankle above the level of your heart as much as possible for 2-3 days.  If your caregiver recommends crutches, use them as instructed. Gradually put weight on the affected ankle. Continue to use crutches or a cane until you can walk without feeling pain in your ankle.  If you have a plaster splint, wear the splint as directed by your caregiver. Do not rest it on anything harder than a pillow for the first 24 hours. Do not put weight on it. Do not get it wet. You may take it off to take a shower or bath.  You may have been given an elastic bandage to wear around your ankle to provide support. If the elastic bandage is too tight (you have numbness or tingling in your foot or your foot becomes cold and blue), adjust the bandage to make it comfortable.  If you have an air splint, you may blow more air into it or let air out to make it more comfortable. You may take your splint off at night and before taking a shower or bath. Wiggle your toes in the splint several times per day to decrease swelling. SEEK MEDICAL CARE IF:   You have rapidly increasing bruising or swelling.  Your toes feel extremely cold or you lose feeling in your foot.  Your pain is not relieved with medicine. SEEK IMMEDIATE MEDICAL CARE IF:  Your toes are numb or blue.  You have severe pain that is increasing. MAKE SURE YOU:   Understand these instructions.  Will watch your condition.  Will get help right away if you are not doing well or get worse. Document Released: 05/16/2005 Document Revised: 02/08/2012 Document Reviewed: 05/28/2011 [facility] Patient Information 2015 [place], [place]. This information is not intended to replace advice given to you by your health care provider. Make sure you discuss any questions you have with your health care provider.  Contusion A contusion is a deep bruise. Contusions are the result of an injury that caused  bleeding under the skin. The contusion may turn blue, purple, or yellow. Minor injuries will give you a painless contusion, but more severe contusions may stay painful and swollen for a few weeks.  CAUSES  A contusion is usually caused by a blow, trauma, or direct force to an area of the body. SYMPTOMS   Swelling and redness of the injured area.  Bruising of the injured area.  Tenderness and soreness of the injured area.  Pain. DIAGNOSIS  The diagnosis can be made by taking a history and physical exam. An X-ray, CT scan, or MRI may be needed to determine if there were any associated injuries, such as fractures. TREATMENT  Specific treatment will depend on what area of the body was injured. In general, the best treatment for a contusion is resting, icing, elevating, and applying cold compresses to the injured area. Over-the-counter medicines may also be recommended for pain control. Ask your caregiver what the best treatment is for your contusion. HOME CARE INSTRUCTIONS   Put ice on the injured area.  Put ice in a plastic bag.  Place a towel between your skin and the bag.  Leave the ice on for 15-20 minutes, 3-4 times a day, or as directed by your health care provider.  Only take over-the-counter or prescription medicines for pain, discomfort, or fever as directed by your caregiver. Your caregiver may recommend avoiding anti-inflammatory medicines (aspirin, ibuprofen, and naproxen) for 48 hours because these medicines may increase bruising.  Rest the injured area.  If possible, elevate the injured area to reduce swelling. SEEK IMMEDIATE MEDICAL CARE IF:   You have increased bruising or swelling.  You have pain that is getting worse.  Your swelling or pain is not relieved with medicines. MAKE SURE YOU:   Understand these instructions.  Will watch your condition.  Will get help right away if you are not doing well or get worse. Document Released: 02/23/2005 Document Revised:  05/21/2013 Document Reviewed: 03/21/2011 [facility] Patient Information 2015 [place], [place]. This information is not intended to replace advice given to you by your health care provider. Make sure you discuss any questions you have with your health care provider.  Laceration Care, Adult A laceration is a cut or lesion that goes through all layers of the skin and into the tissue just beneath the skin. TREATMENT  Some lacerations may not require closure. Some lacerations may not be able to be closed due to an increased risk of infection. It is important to see your caregiver as soon as possible after an injury to minimize the risk of infection and maximize the opportunity for successful closure. If closure is appropriate, pain medicines may be given, if needed. The wound will be cleaned to help prevent infection. Your caregiver will use stitches (sutures), staples, wound glue (adhesive), or skin adhesive strips to repair the laceration. These tools bring the skin edges together to  allow for faster healing and a better cosmetic outcome. However, all wounds will heal with a scar. Once the wound has healed, scarring can be minimized by covering the wound with sunscreen during the day for 1 full year. HOME CARE INSTRUCTIONS  For sutures or staples:  Keep the wound clean and dry.  If you were given a bandage (dressing), you should change it at least once a day. Also, change the dressing if it becomes wet or dirty, or as directed by your caregiver.  Wash the wound with soap and water 2 times a day. Rinse the wound off with water to remove all soap. Pat the wound dry with a clean towel.  After cleaning, apply a thin layer of the antibiotic ointment as recommended by your caregiver. This will help prevent infection and keep the dressing from sticking.  You may shower as usual after the first 24 hours. Do not soak the wound in water until the sutures are removed.  Only take over-the-counter or prescription  medicines for pain, discomfort, or fever as directed by your caregiver.  Get your sutures or staples removed as directed by your caregiver. For skin adhesive strips:  Keep the wound clean and dry.  Do not get the skin adhesive strips wet. You may bathe carefully, using caution to keep the wound dry.  If the wound gets wet, pat it dry with a clean towel.  Skin adhesive strips will fall off on their own. You may trim the strips as the wound heals. Do not remove skin adhesive strips that are still stuck to the wound. They will fall off in time. For wound adhesive:  You may briefly wet your wound in the shower or bath. Do not soak or scrub the wound. Do not swim. Avoid periods of heavy perspiration until the skin adhesive has fallen off on its own. After showering or bathing, gently pat the wound dry with a clean towel.  Do not apply liquid medicine, cream medicine, or ointment medicine to your wound while the skin adhesive is in place. This may loosen the film before your wound is healed.  If a dressing is placed over the wound, be careful not to apply tape directly over the skin adhesive. This may cause the adhesive to be pulled off before the wound is healed.  Avoid prolonged exposure to sunlight or tanning lamps while the skin adhesive is in place. Exposure to ultraviolet light in the first year will darken the scar.  The skin adhesive will usually remain in place for 5 to 10 days, then naturally fall off the skin. Do not pick at the adhesive film. You may need a tetanus shot if:  You cannot remember when you had your last tetanus shot.  You have never had a tetanus shot. If you get a tetanus shot, your arm may swell, get red, and feel warm to the touch. This is common and not a problem. If you need a tetanus shot and you choose not to have one, there is a rare chance of getting tetanus. Sickness from tetanus can be serious. SEEK MEDICAL CARE IF:   You have redness, swelling, or  increasing pain in the wound.  You see a red line that goes away from the wound.  You have yellowish-white fluid (pus) coming from the wound.  You have a fever.  You notice a bad smell coming from the wound or dressing.  Your wound breaks open before or after sutures have been removed.  You  notice something coming out of the wound such as wood or glass.  Your wound is on your hand or foot and you cannot move a finger or toe. SEEK IMMEDIATE MEDICAL CARE IF:   Your pain is not controlled with prescribed medicine.  You have severe swelling around the wound causing pain and numbness or a change in color in your arm, hand, leg, or foot.  Your wound splits open and starts bleeding.  You have worsening numbness, weakness, or loss of function of any joint around or beyond the wound.  You develop painful lumps near the wound or on the skin anywhere on your body. MAKE SURE YOU:   Understand these instructions.  Will watch your condition.  Will get help right away if you are not doing well or get worse. Document Released: 05/16/2005 Document Revised: 08/08/2011 Document Reviewed: 11/09/2010 [facility] Patient Information 2015 [place], [place]. This information is not intended to replace advice given to you by your health care provider. Make sure you discuss any questions you have with your health care provider.

## 2015-01-14 ENCOUNTER — Ambulatory Visit
Admission: RE | Admit: 2015-01-14 | Discharge: 2015-01-14 | Disposition: A | Discharge status: Home / Self Care | Payer: No Typology Code available for payment source | Source: Ambulatory Visit | Attending: Occupational Medicine | Admitting: Occupational Medicine

## 2015-01-14 ENCOUNTER — Other Ambulatory Visit: Payer: Self-pay | Admitting: Occupational Medicine

## 2015-01-14 DIAGNOSIS — Z0289 Encounter for other administrative examinations: Secondary | ICD-10-CM

## 2016-09-07 DIAGNOSIS — H5203 Hypermetropia, bilateral: Secondary | ICD-10-CM | POA: Diagnosis not present

## 2016-12-03 IMAGING — CR DG CHEST 2V
2 series · 2 of 2 positions shown · non-contrast
Comparison: CT abdomen pelvis 05/26/2011.

CLINICAL DATA: Physical examination of employee, exit physical
exam.

EXAM:
CHEST  2 VIEW

[w chest pa]
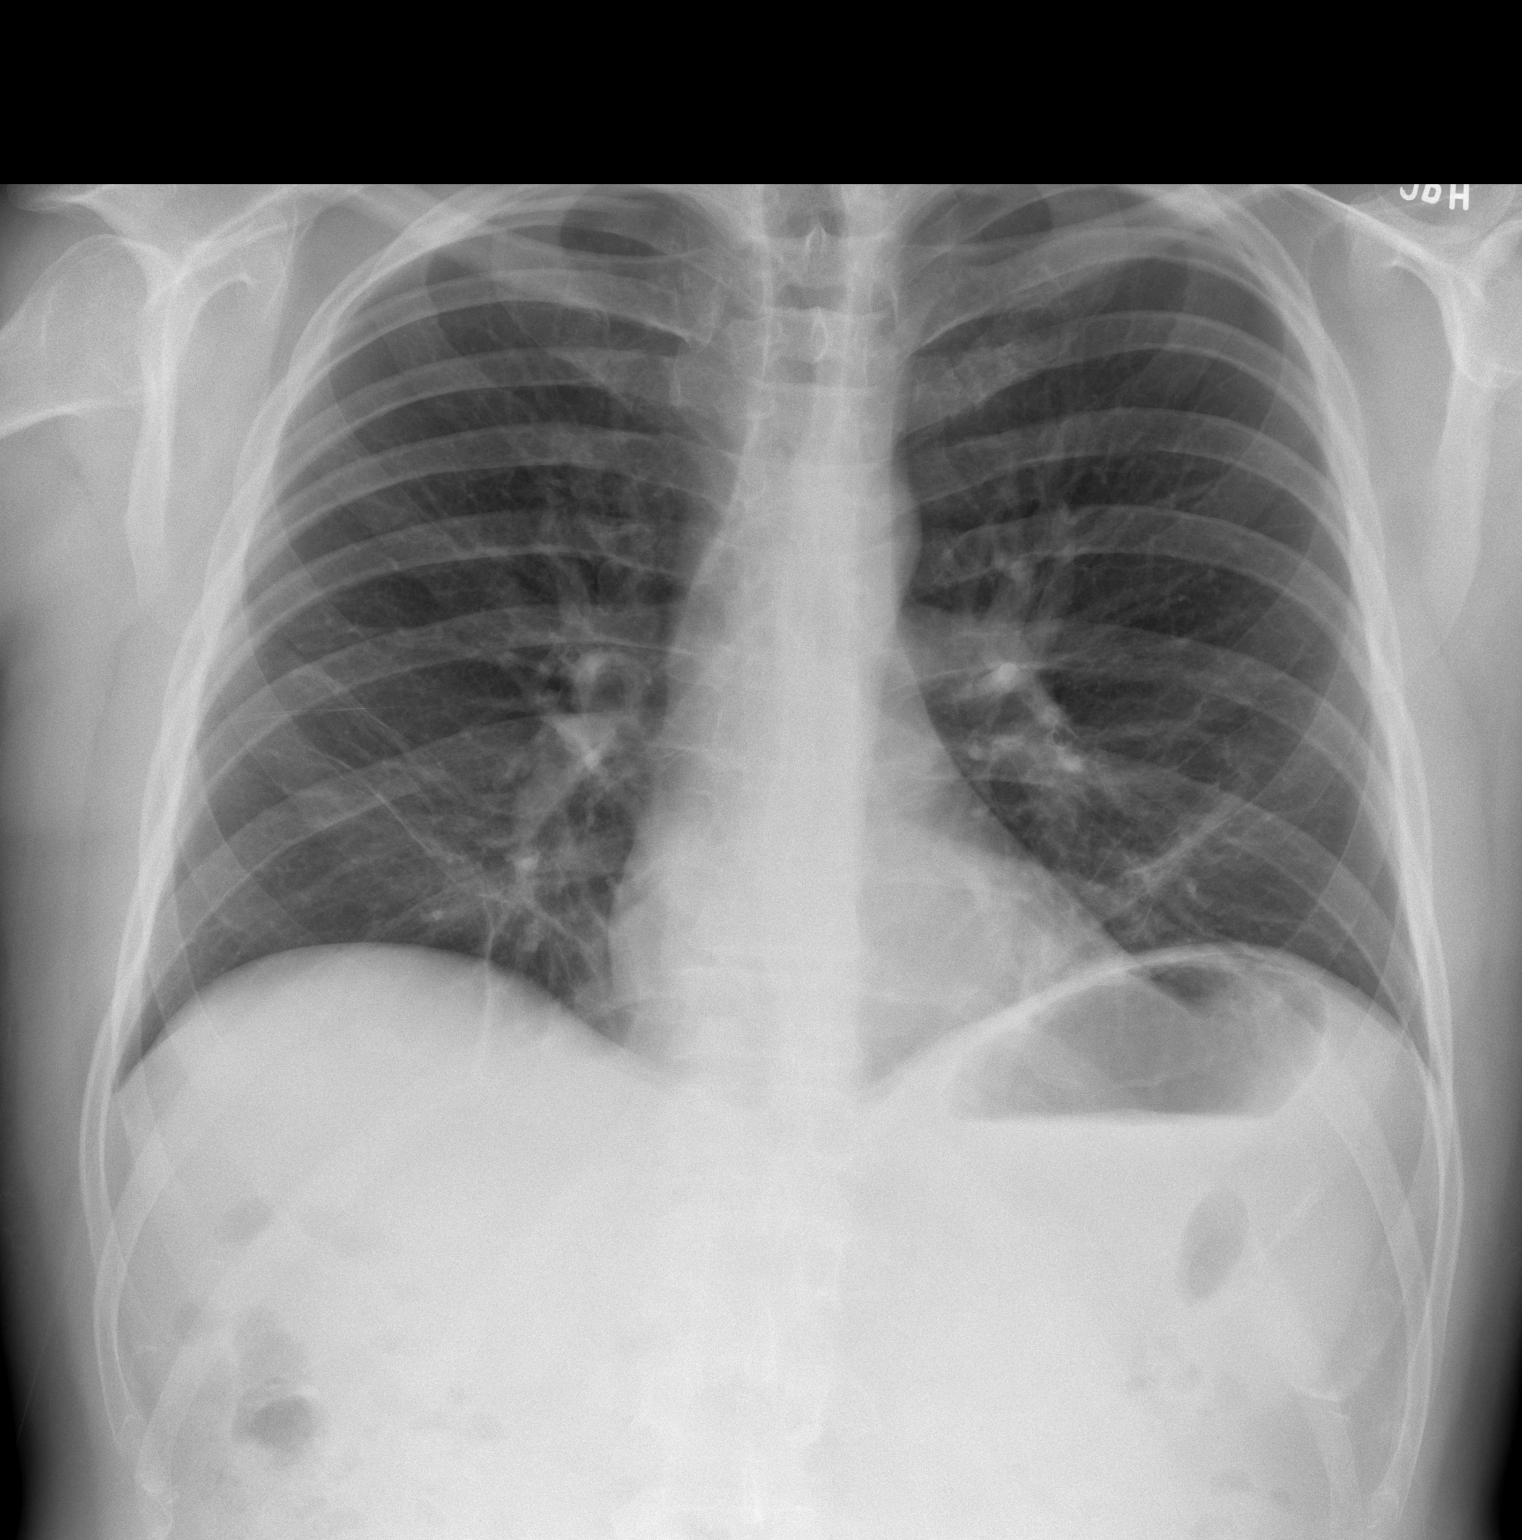

[w chest lat]
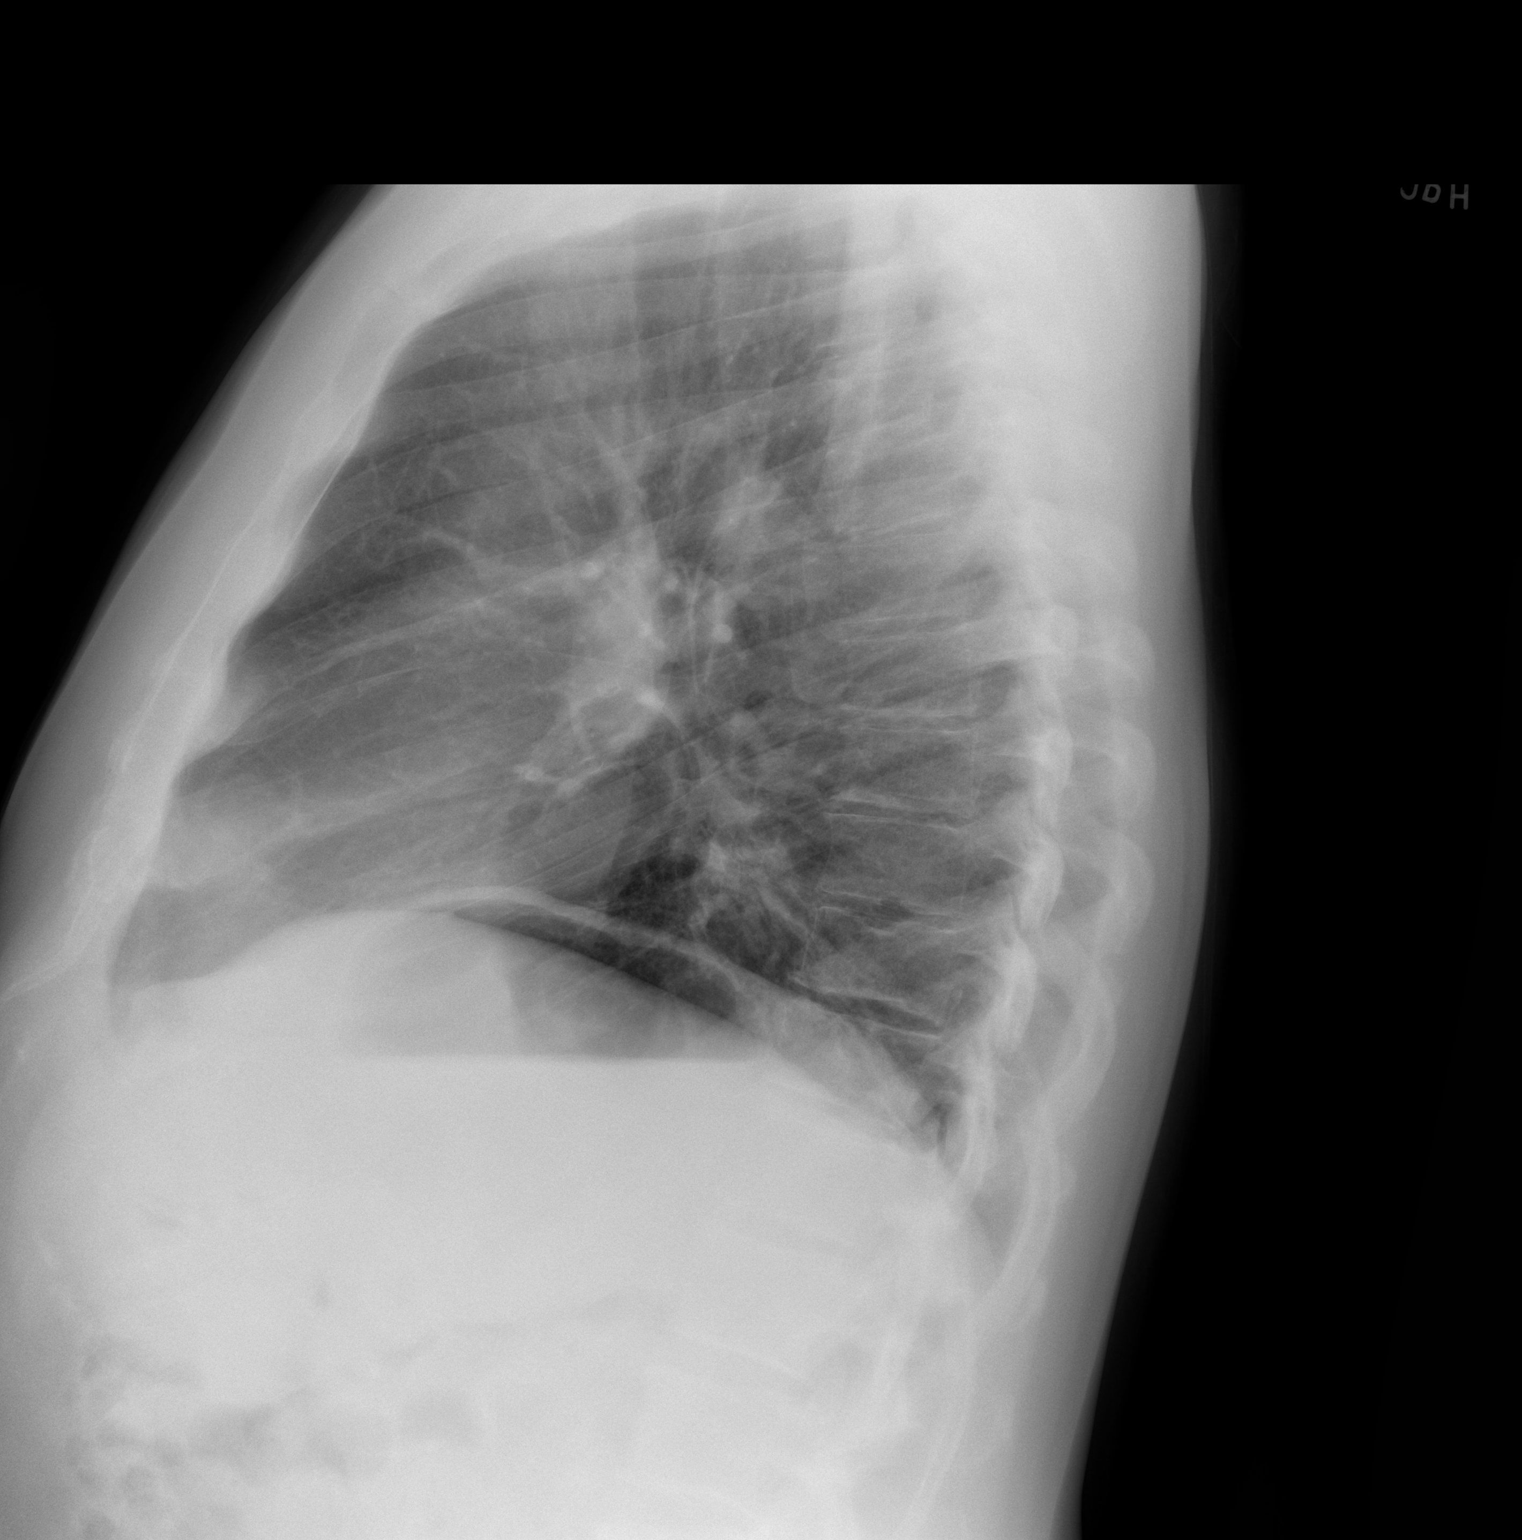

[2 of 2 positions shown; findings below may reference images not displayed]

FINDINGS: Trachea is midline. Heart size normal. Linear areas of volume loss
are seen at the lung bases, as on 05/26/2011. Lungs are somewhat low
in volume. No airspace consolidation or pleural fluid.
IMPRESSION: Bibasilar scarring.  No acute findings.

## 2016-12-08 DIAGNOSIS — R109 Unspecified abdominal pain: Secondary | ICD-10-CM | POA: Diagnosis not present

## 2016-12-08 DIAGNOSIS — N200 Calculus of kidney: Secondary | ICD-10-CM | POA: Diagnosis not present

## 2017-05-11 DIAGNOSIS — Z0000 Encounter for general adult medical examination without abnormal findings: Secondary | ICD-10-CM | POA: Diagnosis not present

## 2017-05-11 DIAGNOSIS — E785 Hyperlipidemia, unspecified: Secondary | ICD-10-CM | POA: Diagnosis not present

## 2017-05-21 DIAGNOSIS — J208 Acute bronchitis due to other specified organisms: Secondary | ICD-10-CM | POA: Diagnosis not present

## 2017-06-21 DIAGNOSIS — Z8371 Family history of colonic polyps: Secondary | ICD-10-CM | POA: Diagnosis not present

## 2017-06-21 DIAGNOSIS — K921 Melena: Secondary | ICD-10-CM | POA: Diagnosis not present

## 2017-08-10 DIAGNOSIS — J329 Chronic sinusitis, unspecified: Secondary | ICD-10-CM | POA: Diagnosis not present

## 2018-03-12 DIAGNOSIS — Z23 Encounter for immunization: Secondary | ICD-10-CM | POA: Diagnosis not present

## 2018-03-12 DIAGNOSIS — H5203 Hypermetropia, bilateral: Secondary | ICD-10-CM | POA: Diagnosis not present

## 2018-06-22 DIAGNOSIS — Z1211 Encounter for screening for malignant neoplasm of colon: Secondary | ICD-10-CM | POA: Diagnosis not present

## 2018-06-22 DIAGNOSIS — Z0000 Encounter for general adult medical examination without abnormal findings: Secondary | ICD-10-CM | POA: Diagnosis not present

## 2018-06-22 DIAGNOSIS — Z23 Encounter for immunization: Secondary | ICD-10-CM | POA: Diagnosis not present

## 2018-06-22 DIAGNOSIS — E785 Hyperlipidemia, unspecified: Secondary | ICD-10-CM | POA: Diagnosis not present
